# Patient Record
Sex: Male | Born: 1980 | Race: Black or African American | Hispanic: No | Marital: Single | Smoking: Never smoker
Health system: Southern US, Community
[De-identification: ages and names within clinical notes are randomized; demographics above are authoritative.]

## PROBLEM LIST (undated history)

## (undated) DIAGNOSIS — G43909 Migraine, unspecified, not intractable, without status migrainosus: Secondary | ICD-10-CM

## (undated) HISTORY — PX: OTHER SURGICAL HISTORY: SHX169

---

## 2008-06-29 ENCOUNTER — Emergency Department (HOSPITAL_COMMUNITY): Admission: EM | Admit: 2008-06-29 | Discharge: 2008-06-29 | Payer: Self-pay | Admitting: Emergency Medicine

## 2009-07-18 ENCOUNTER — Emergency Department (HOSPITAL_COMMUNITY): Admission: EM | Admit: 2009-07-18 | Discharge: 2009-07-18 | Payer: Self-pay | Admitting: Emergency Medicine

## 2009-08-08 ENCOUNTER — Emergency Department (HOSPITAL_COMMUNITY): Admission: EM | Admit: 2009-08-08 | Discharge: 2009-08-08 | Payer: Self-pay | Admitting: Emergency Medicine

## 2010-01-25 ENCOUNTER — Emergency Department (HOSPITAL_COMMUNITY): Admission: EM | Admit: 2010-01-25 | Discharge: 2010-01-25 | Payer: Self-pay | Admitting: Emergency Medicine

## 2010-04-16 ENCOUNTER — Emergency Department (HOSPITAL_COMMUNITY): Admission: EM | Admit: 2010-04-16 | Discharge: 2010-04-16 | Payer: Self-pay | Admitting: Emergency Medicine

## 2010-10-10 LAB — DIFFERENTIAL
Basophils Absolute: 0 10*3/uL (ref 0.0–0.1)
Basophils Relative: 0 % (ref 0–1)
Eosinophils Absolute: 0 10*3/uL (ref 0.0–0.7)
Lymphocytes Relative: 35 % (ref 12–46)
Lymphs Abs: 1.7 10*3/uL (ref 0.7–4.0)
Monocytes Absolute: 0.5 10*3/uL (ref 0.1–1.0)
Monocytes Relative: 10 % (ref 3–12)
Neutrophils Relative %: 54 % (ref 43–77)

## 2010-10-10 LAB — CBC
Hemoglobin: 15.1 g/dL (ref 13.0–17.0)
MCH: 31.5 pg (ref 26.0–34.0)
MCV: 91.3 fL (ref 78.0–100.0)
RBC: 4.8 MIL/uL (ref 4.22–5.81)
RDW: 12.9 % (ref 11.5–15.5)
WBC: 4.9 10*3/uL (ref 4.0–10.5)

## 2010-10-10 LAB — COMPREHENSIVE METABOLIC PANEL
AST: 22 U/L (ref 0–37)
Calcium: 9.1 mg/dL (ref 8.4–10.5)
Creatinine, Ser: 0.96 mg/dL (ref 0.4–1.5)
Glucose, Bld: 84 mg/dL (ref 70–99)
Potassium: 3.7 mEq/L (ref 3.5–5.1)

## 2010-10-10 LAB — HEMOCCULT GUIAC POC 1CARD (OFFICE): Fecal Occult Bld: NEGATIVE

## 2011-05-02 LAB — CBC
HCT: 44.9 % (ref 39.0–52.0)
Hemoglobin: 15 g/dL (ref 13.0–17.0)
MCHC: 33.4 g/dL (ref 30.0–36.0)
MCV: 94.8 fL (ref 78.0–100.0)
RDW: 13.7 % (ref 11.5–15.5)

## 2011-05-02 LAB — URINE MICROSCOPIC-ADD ON

## 2011-05-02 LAB — COMPREHENSIVE METABOLIC PANEL
AST: 33 U/L (ref 0–37)
Albumin: 4.2 g/dL (ref 3.5–5.2)
BUN: 10 mg/dL (ref 6–23)
Calcium: 9.1 mg/dL (ref 8.4–10.5)
Total Protein: 7.3 g/dL (ref 6.0–8.3)

## 2011-05-02 LAB — DIFFERENTIAL
Eosinophils Absolute: 0 10*3/uL (ref 0.0–0.7)
Eosinophils Relative: 1 % (ref 0–5)
Lymphs Abs: 2.3 10*3/uL (ref 0.7–4.0)
Monocytes Relative: 12 % (ref 3–12)
Neutrophils Relative %: 49 % (ref 43–77)

## 2011-05-02 LAB — URINALYSIS, ROUTINE W REFLEX MICROSCOPIC
Glucose, UA: NEGATIVE mg/dL
Protein, ur: 30 mg/dL — AB
Specific Gravity, Urine: 1.037 — ABNORMAL HIGH (ref 1.005–1.030)
Urobilinogen, UA: 1 mg/dL (ref 0.0–1.0)
pH: 6 (ref 5.0–8.0)

## 2013-01-03 ENCOUNTER — Emergency Department (HOSPITAL_COMMUNITY)
Admission: EM | Admit: 2013-01-03 | Discharge: 2013-01-03 | Discharge status: Against Medical Advice | Payer: Self-pay | Attending: Emergency Medicine | Admitting: Emergency Medicine

## 2013-01-03 ENCOUNTER — Encounter (HOSPITAL_COMMUNITY): Payer: Self-pay | Admitting: Family Medicine

## 2013-01-03 DIAGNOSIS — R110 Nausea: Secondary | ICD-10-CM | POA: Insufficient documentation

## 2013-01-03 DIAGNOSIS — H53149 Visual discomfort, unspecified: Secondary | ICD-10-CM | POA: Insufficient documentation

## 2013-01-03 DIAGNOSIS — G43909 Migraine, unspecified, not intractable, without status migrainosus: Secondary | ICD-10-CM | POA: Insufficient documentation

## 2013-01-03 HISTORY — DX: Migraine, unspecified, not intractable, without status migrainosus: G43.909

## 2013-01-03 NOTE — ED Notes (Signed)
Per pt sts migraine that started this am with hx of the same. sts left side of his head associated with nausea and photophobia.

## 2013-01-03 NOTE — ED Notes (Signed)
Pt did not answer X3.

## 2018-06-14 ENCOUNTER — Emergency Department (HOSPITAL_COMMUNITY): Payer: BC Managed Care – PPO

## 2018-06-14 ENCOUNTER — Observation Stay (HOSPITAL_COMMUNITY)
Admission: EM | Admit: 2018-06-14 | Discharge: 2018-06-15 | Disposition: A | Discharge status: Home / Self Care | Payer: BC Managed Care – PPO | Attending: Internal Medicine | Admitting: Internal Medicine

## 2018-06-14 ENCOUNTER — Other Ambulatory Visit: Payer: Self-pay

## 2018-06-14 ENCOUNTER — Encounter (HOSPITAL_COMMUNITY): Payer: Self-pay | Admitting: Emergency Medicine

## 2018-06-14 DIAGNOSIS — M940 Chondrocostal junction syndrome [Tietze]: Principal | ICD-10-CM

## 2018-06-14 DIAGNOSIS — E669 Obesity, unspecified: Secondary | ICD-10-CM | POA: Diagnosis not present

## 2018-06-14 DIAGNOSIS — R200 Anesthesia of skin: Secondary | ICD-10-CM | POA: Diagnosis not present

## 2018-06-14 DIAGNOSIS — R079 Chest pain, unspecified: Secondary | ICD-10-CM | POA: Diagnosis present

## 2018-06-14 DIAGNOSIS — R0789 Other chest pain: Secondary | ICD-10-CM | POA: Diagnosis present

## 2018-06-14 DIAGNOSIS — E66813 Obesity, class 3: Secondary | ICD-10-CM

## 2018-06-14 LAB — CBC WITH DIFFERENTIAL/PLATELET
ABS IMMATURE GRANULOCYTES: 0.02 10*3/uL (ref 0.00–0.07)
Basophils Absolute: 0 10*3/uL (ref 0.0–0.1)
Basophils Relative: 0 %
EOS PCT: 2 %
Eosinophils Absolute: 0.1 10*3/uL (ref 0.0–0.5)
HEMATOCRIT: 45.2 % (ref 39.0–52.0)
HEMOGLOBIN: 14.4 g/dL (ref 13.0–17.0)
Immature Granulocytes: 0 %
LYMPHS PCT: 39 %
Lymphs Abs: 2.6 10*3/uL (ref 0.7–4.0)
MCH: 29.8 pg (ref 26.0–34.0)
MCHC: 31.9 g/dL (ref 30.0–36.0)
MCV: 93.6 fL (ref 80.0–100.0)
MONO ABS: 0.8 10*3/uL (ref 0.1–1.0)
MONOS PCT: 11 %
Neutro Abs: 3.1 10*3/uL (ref 1.7–7.7)
Neutrophils Relative %: 48 %
Platelets: 206 10*3/uL (ref 150–400)
RBC: 4.83 MIL/uL (ref 4.22–5.81)
RDW: 12.6 % (ref 11.5–15.5)
WBC: 6.6 10*3/uL (ref 4.0–10.5)
nRBC: 0 % (ref 0.0–0.2)

## 2018-06-14 LAB — COMPREHENSIVE METABOLIC PANEL
ALK PHOS: 48 U/L (ref 38–126)
ALT: 13 U/L (ref 0–44)
AST: 20 U/L (ref 15–41)
Albumin: 3.7 g/dL (ref 3.5–5.0)
Anion gap: 8 (ref 5–15)
BILIRUBIN TOTAL: 0.8 mg/dL (ref 0.3–1.2)
BUN: 13 mg/dL (ref 6–20)
CALCIUM: 7.7 mg/dL — AB (ref 8.9–10.3)
CO2: 22 mmol/L (ref 22–32)
CREATININE: 1.42 mg/dL — AB (ref 0.61–1.24)
Chloride: 113 mmol/L — ABNORMAL HIGH (ref 98–111)
Glucose, Bld: 78 mg/dL (ref 70–99)
Potassium: 3.2 mmol/L — ABNORMAL LOW (ref 3.5–5.1)
Sodium: 143 mmol/L (ref 135–145)
TOTAL PROTEIN: 6.6 g/dL (ref 6.5–8.1)

## 2018-06-14 LAB — TROPONIN I

## 2018-06-14 MED ORDER — NITROGLYCERIN 0.4 MG SL SUBL
0.4000 mg | SUBLINGUAL_TABLET | SUBLINGUAL | Status: AC | PRN
  Administered 2018-06-14 – 2018-06-15 (×3): 0.4 mg via SUBLINGUAL
  Filled 2018-06-14 (×2): qty 1

## 2018-06-14 MED ORDER — FENTANYL CITRATE (PF) 100 MCG/2ML IJ SOLN
50.0000 ug | Freq: Once | INTRAMUSCULAR | Status: AC
  Administered 2018-06-15: 50 ug via INTRAVENOUS
  Filled 2018-06-14: qty 2

## 2018-06-14 MED ORDER — IOPAMIDOL (ISOVUE-370) INJECTION 76%
100.0000 mL | Freq: Once | INTRAVENOUS | Status: AC | PRN
  Administered 2018-06-14: 100 mL via INTRAVENOUS

## 2018-06-14 MED ORDER — IOPAMIDOL (ISOVUE-370) INJECTION 76%
INTRAVENOUS | Status: AC
  Filled 2018-06-14: qty 100

## 2018-06-14 MED ORDER — ASPIRIN 81 MG PO CHEW
324.0000 mg | CHEWABLE_TABLET | Freq: Once | ORAL | Status: AC
  Administered 2018-06-15: 324 mg via ORAL
  Filled 2018-06-14: qty 4

## 2018-06-14 NOTE — ED Notes (Signed)
After nitro, pt says his pain has felt like it has moved farther left in his chest and feels like his anterior thigh is falling asleep. Updated on plan at this time

## 2018-06-14 NOTE — ED Notes (Signed)
Pt is in ct

## 2018-06-14 NOTE — ED Triage Notes (Signed)
Pt reports left sided chest pain that radiated to his neck and left arm that started at 1500 today. Pain worse with inspiration, c/o left hand swelling and numbness. Denies shortness of breath/nausea/diaphoresis.

## 2018-06-14 NOTE — ED Provider Notes (Signed)
Emergency Department Provider Note   I have reviewed the triage vital signs and the nursing notes.   HISTORY  Chief Complaint Chest Pain   HPI [name] is a 37 y.o. male without significant past medical history the presents to the emergency department today with chest pain.  Patient states that he was at rest and he had acute onset of left-sided and central chest pain that radiated to his left neck and left arm.  Shortly after he started having left arm paresthesias.  This was new for him he got his blood pressure check it by the nurse at school and was told that it was high.  Patient without any headache, vision changes, abdominal pain or lower extremity symptoms during this time.  Came here for further evaluation.  No recent illnesses, fever or cough. No other associated or modifying symptoms.    Past Medical History:  Diagnosis Date  . Migraine     Patient Active Problem List   Diagnosis Date Noted  . Chest pain 06/15/2018    History reviewed. No pertinent surgical history.    Allergies Patient has no known allergies.  No family history on file.  Social History Social History   Tobacco Use  . Smoking status: Never Smoker  . Smokeless tobacco: Never Used  Substance Use Topics  . Alcohol use: Yes    Frequency: Never    Comment: occasional  . Drug use: Never    Review of Systems  All other systems negative except as documented in the HPI. All pertinent positives and negatives as reviewed in the HPI. ____________________________________________   PHYSICAL EXAM:  VITAL SIGNS: ED Triage Vitals  Enc Vitals Group     BP 06/14/18 1845 130/78     Pulse Rate 06/14/18 1845 72     Resp 06/14/18 1845 20     Temp 06/14/18 1845 98.5 F (36.9 C)     Temp Source 06/14/18 1845 Oral     SpO2 06/14/18 1845 96 %     Weight 06/14/18 1842 265 lb (120.2 kg)     Height 06/14/18 1842 6' (1.829 m)     Head Circumference --      Peak Flow --      Pain Score  06/14/18 1842 8     Pain Loc --      Pain Edu? --      Excl. in GC? --     Constitutional: Alert and oriented. Well appearing and in no acute distress. Eyes: Conjunctivae are normal. PERRL. EOMI. Head: Atraumatic. Nose: No congestion/rhinnorhea. Mouth/Throat: Mucous membranes are moist.  Oropharynx non-erythematous. Neck: No stridor.  No meningeal signs.   Cardiovascular: Normal rate, regular rhythm. Good peripheral circulation. Grossly normal heart sounds.   Respiratory: Normal respiratory effort.  No retractions. Lungs CTAB. Gastrointestinal: Soft and nontender. No distention.  Musculoskeletal: No lower extremity tenderness nor edema. No gross deformities of extremities. Neurologic:  Normal speech and language. No gross focal neurologic deficits are appreciated.  Skin:  Skin is warm, dry and intact. No rash noted.   ____________________________________________   LABS (all labs ordered are listed, but only abnormal results are displayed)  Labs Reviewed  COMPREHENSIVE METABOLIC PANEL - Abnormal; Notable for the following components:      Result Value   Potassium 3.2 (*)    Chloride 113 (*)    Creatinine, Ser 1.42 (*)    Calcium 7.7 (*)    All other components within normal limits  CBC WITH DIFFERENTIAL/PLATELET  TROPONIN I  RAPID URINE DRUG SCREEN, HOSP PERFORMED   ____________________________________________  EKG   EKG Interpretation  Date/Time:  Monday June 14 2018 19:27:35 EST Ventricular Rate:  79 PR Interval:    QRS Duration: 89 QT Interval:  382 QTC Calculation: 438 R Axis:   19 Text Interpretation:  Sinus rhythm No old tracing to compare Confirmed by Marily MemosMesner, Banita Lehn [phone]([id number]) on 06/14/2018 10:01:29 PM       ____________________________________________  RADIOLOGY  Ct Angio Chest/abd/pel For Dissection W And/or Wo Contrast  Result Date: 06/14/2018 CLINICAL DATA:  Left-sided chest pain radiating to neck and left arm. Pain worse with inspiration. Left  hand swelling and numbness. EXAM: CT ANGIOGRAPHY CHEST, ABDOMEN AND PELVIS TECHNIQUE: Multidetector CT imaging through the chest, abdomen and pelvis was performed using the standard protocol during bolus administration of intravenous contrast. Multiplanar reconstructed images and MIPs were obtained and reviewed to evaluate the vascular anatomy. CONTRAST:  100mL ISOVUE-370 IOPAMIDOL (ISOVUE-370) INJECTION 76% COMPARISON:  None. FINDINGS: CTA CHEST FINDINGS Cardiovascular: Satisfactory opacification of the pulmonary arteries to the segmental level. No evidence of pulmonary embolism. Normal heart size. No pericardial effusion. Mediastinum/Nodes: No enlarged mediastinal, hilar, or axillary lymph nodes. Thyroid gland, trachea, and esophagus demonstrate no significant findings. Lungs/Pleura: Central airways are normal. Scattered subsegmental atelectasis. No pneumothorax. No suspicious pulmonary nodules, masses, or infiltrates. Musculoskeletal: See below. Review of the MIP images confirms the above findings. CTA ABDOMEN AND PELVIS FINDINGS VASCULAR Aorta: Normal caliber aorta without aneurysm, dissection, vasculitis or significant stenosis. Celiac: Patent without evidence of aneurysm, dissection, vasculitis or significant stenosis. SMA: Patent without evidence of aneurysm, dissection, vasculitis or significant stenosis. Renals: There are 2 right renal arteries, a dominant and an accessory and 1 left renal artery. The renal arteries are unremarkable. IMA: Patent without evidence of aneurysm, dissection, vasculitis or significant stenosis. Inflow: Patent without evidence of aneurysm, dissection, vasculitis or significant stenosis. Veins: No obvious venous abnormality within the limitations of this arterial phase study. Review of the MIP images confirms the above findings. NON-VASCULAR Hepatobiliary: No focal liver abnormality is seen. No gallstones, gallbladder wall thickening, or biliary dilatation. Pancreas: Unremarkable.  No pancreatic ductal dilatation or surrounding inflammatory changes. Spleen: Normal in size without focal abnormality. Adrenals/Urinary Tract: Adrenal glands are unremarkable. Kidneys are normal, without renal calculi, focal lesion, or hydronephrosis. Bladder is unremarkable. Stomach/Bowel: Stomach is within normal limits. Appendix appears normal. No evidence of bowel wall thickening, distention, or inflammatory changes. Lymphatic: No significant vascular findings are present. No enlarged abdominal or pelvic lymph nodes. Reproductive: Prostate is unremarkable. Other: Left inguinal hernia repair, intact. Fat containing umbilical hernia. Musculoskeletal: No acute or significant osseous findings. Review of the MIP images confirms the above findings. IMPRESSION: 1. No abdominal aortic aneurysm or dissection. No cause for symptoms identified. 2. Left inguinal hernia repair. 3. Scattered subsegmental atelectasis throughout the lungs. Electronically Signed   By: Gerome Samavid  Williams III M.D   On: 06/14/2018 21:32    ____________________________________________   PROCEDURES  Procedure(s) performed:   Procedures   ____________________________________________   INITIAL IMPRESSION / ASSESSMENT AND PLAN / ED COURSE  Initial concern for ACS secondary to his story versus dissection or pulmonary embolus.  Also considered CVA but thought to be less likely with just paresthesias in his left arm.   Troponin EKG unremarkable.  CT dissection study done unremarkable.  Patient's pain improved with nitroglycerin however seems to have returned so we will give another dose of nitroglycerin and fentanyl.  He is only 37 years old  and does not have a lot of risk factors however his story is slightly concerning for an ACS component especially with his pain is returning after improving initially with the nitroglycerin.  Consider GI cause very does have any nausea, burning or other history of reflux.  Will discuss with hospitalist  for observation.  Pertinent labs & imaging results that were available during my care of the patient were reviewed by me and considered in my medical decision making (see chart for details).  ____________________________________________  FINAL CLINICAL IMPRESSION(S) / ED DIAGNOSES  Final diagnoses:  Nonspecific chest pain     MEDICATIONS GIVEN DURING THIS VISIT:  Medications  nitroGLYCERIN (NITROSTAT) SL tablet 0.4 mg (0.4 mg Sublingual Given 06/14/18 1932)  iopamidol (ISOVUE-370) 76 % injection (has no administration in time range)  iopamidol (ISOVUE-370) 76 % injection 100 mL (100 mLs Intravenous Contrast Given 06/14/18 2053)  fentaNYL (SUBLIMAZE) injection 50 mcg (50 mcg Intravenous Given 06/15/18 0016)  aspirin chewable tablet 324 mg (324 mg Oral Given 06/15/18 0017)     NEW OUTPATIENT MEDICATIONS STARTED DURING THIS VISIT:  There are no discharge medications for this patient.   Note:  This note was prepared with assistance of Dragon voice recognition software. Occasional wrong-word or sound-a-like substitutions may have occurred due to the inherent limitations of voice recognition software.   Marily Memos, MD 06/15/18 [phone]

## 2018-06-15 ENCOUNTER — Encounter (HOSPITAL_COMMUNITY): Payer: Self-pay | Admitting: Internal Medicine

## 2018-06-15 ENCOUNTER — Observation Stay (HOSPITAL_COMMUNITY): Payer: BC Managed Care – PPO

## 2018-06-15 ENCOUNTER — Observation Stay (HOSPITAL_BASED_OUTPATIENT_CLINIC_OR_DEPARTMENT_OTHER): Payer: BC Managed Care – PPO

## 2018-06-15 ENCOUNTER — Other Ambulatory Visit: Payer: Self-pay

## 2018-06-15 DIAGNOSIS — R0789 Other chest pain: Secondary | ICD-10-CM | POA: Diagnosis not present

## 2018-06-15 DIAGNOSIS — R079 Chest pain, unspecified: Secondary | ICD-10-CM | POA: Diagnosis present

## 2018-06-15 DIAGNOSIS — E66813 Obesity, class 3: Secondary | ICD-10-CM

## 2018-06-15 DIAGNOSIS — M940 Chondrocostal junction syndrome [Tietze]: Secondary | ICD-10-CM

## 2018-06-15 LAB — RAPID URINE DRUG SCREEN, HOSP PERFORMED
AMPHETAMINES: NOT DETECTED
Barbiturates: NOT DETECTED
Benzodiazepines: NOT DETECTED
COCAINE: NOT DETECTED
Opiates: NOT DETECTED
TETRAHYDROCANNABINOL: NOT DETECTED

## 2018-06-15 LAB — BASIC METABOLIC PANEL
Anion gap: 11 (ref 5–15)
BUN: 13 mg/dL (ref 6–20)
CALCIUM: 8.4 mg/dL — AB (ref 8.9–10.3)
CHLORIDE: 107 mmol/L (ref 98–111)
CO2: 22 mmol/L (ref 22–32)
CREATININE: 1.17 mg/dL (ref 0.61–1.24)
GFR calc Af Amer: >60 mL/min (ref >60)
GFR calc non Af Amer: >60 mL/min (ref >60)
GLUCOSE: 121 mg/dL — AB (ref 70–99)
Potassium: 3.5 mmol/L (ref 3.5–5.1)
Sodium: 140 mmol/L (ref 135–145)

## 2018-06-15 LAB — ECHOCARDIOGRAM COMPLETE
Height: 73 in
Weight: 4457.6 oz

## 2018-06-15 LAB — LIPID PANEL
Cholesterol: 149 mg/dL (ref 0–200)
HDL: 49 mg/dL (ref >40)
LDL Cholesterol: 89 mg/dL (ref 0–99)
Total CHOL/HDL Ratio: 3 RATIO
Triglycerides: 53 mg/dL (ref <150)
VLDL: 11 mg/dL (ref 0–40)

## 2018-06-15 LAB — HEMOGLOBIN A1C
Hgb A1c MFr Bld: 5.5 % (ref 4.8–5.6)
Mean Plasma Glucose: 111.15 mg/dL

## 2018-06-15 LAB — HIV ANTIBODY (ROUTINE TESTING W REFLEX): HIV Screen 4th Generation wRfx: NONREACTIVE

## 2018-06-15 LAB — TROPONIN I: Troponin I: <0.03 ng/mL (ref <0.03)

## 2018-06-15 LAB — CBC
HCT: 41.5 % (ref 39.0–52.0)
Hemoglobin: 13.7 g/dL (ref 13.0–17.0)
MCH: 30.3 pg (ref 26.0–34.0)
MCHC: 33 g/dL (ref 30.0–36.0)
MCV: 91.8 fL (ref 80.0–100.0)
Platelets: 180 10*3/uL (ref 150–400)
RBC: 4.52 MIL/uL (ref 4.22–5.81)
RDW: 12.7 % (ref 11.5–15.5)
WBC: 5.5 10*3/uL (ref 4.0–10.5)
nRBC: 0 % (ref 0.0–0.2)

## 2018-06-15 LAB — MRSA PCR SCREENING: MRSA BY PCR: NEGATIVE

## 2018-06-15 MED ORDER — NAPROXEN 500 MG PO TABS: ORAL_TABLET | ORAL | 0 refills | Status: AC

## 2018-06-15 MED ORDER — ACETAMINOPHEN 650 MG RE SUPP: 650.0000 mg | Freq: Four times a day (QID) | RECTAL | Status: DC | PRN

## 2018-06-15 MED ORDER — DICLOFENAC SODIUM 1 % TD GEL
2.0000 g | Freq: Four times a day (QID) | TRANSDERMAL | Status: DC
  Administered 2018-06-15: 2 g via TOPICAL
  Filled 2018-06-15: qty 100

## 2018-06-15 MED ORDER — ONDANSETRON HCL 4 MG PO TABS: 4.0000 mg | ORAL_TABLET | Freq: Four times a day (QID) | ORAL | Status: DC | PRN

## 2018-06-15 MED ORDER — ACETAMINOPHEN 325 MG PO TABS: 650.0000 mg | ORAL_TABLET | Freq: Four times a day (QID) | ORAL | Status: DC | PRN

## 2018-06-15 MED ORDER — DICLOFENAC SODIUM 1 % TD GEL: 2.0000 g | Freq: Four times a day (QID) | TRANSDERMAL | 0 refills | Status: AC

## 2018-06-15 MED ORDER — ONDANSETRON HCL 4 MG/2ML IJ SOLN: 4.0000 mg | Freq: Four times a day (QID) | INTRAMUSCULAR | Status: DC | PRN

## 2018-06-15 MED ORDER — MORPHINE SULFATE (PF) 2 MG/ML IV SOLN
2.0000 mg | INTRAVENOUS | Status: DC | PRN
  Administered 2018-06-15: 2 mg via INTRAVENOUS
  Filled 2018-06-15: qty 1

## 2018-06-15 MED ORDER — ASPIRIN EC 81 MG PO TBEC
81.0000 mg | DELAYED_RELEASE_TABLET | Freq: Every day | ORAL | Status: DC
  Administered 2018-06-15: 81 mg via ORAL
  Filled 2018-06-15: qty 1

## 2018-06-15 MED ORDER — ALUM & MAG HYDROXIDE-SIMETH 200-200-20 MG/5ML PO SUSP
30.0000 mL | Freq: Once | ORAL | Status: AC
  Administered 2018-06-15: 30 mL via ORAL
  Filled 2018-06-15: qty 30

## 2018-06-15 MED ORDER — ENOXAPARIN SODIUM 40 MG/0.4ML ~~LOC~~ SOLN: 40.0000 mg | SUBCUTANEOUS | Status: DC

## 2018-06-15 MED ORDER — KETOROLAC TROMETHAMINE 30 MG/ML IJ SOLN
30.0000 mg | Freq: Once | INTRAMUSCULAR | Status: AC
  Administered 2018-06-15: 30 mg via INTRAVENOUS
  Filled 2018-06-15: qty 1

## 2018-06-15 MED ORDER — ACETAMINOPHEN 325 MG PO TABS: 650.0000 mg | ORAL_TABLET | Freq: Four times a day (QID) | ORAL | Status: AC | PRN

## 2018-06-15 MED ORDER — FAMOTIDINE 20 MG PO TABS: 20.0000 mg | ORAL_TABLET | Freq: Two times a day (BID) | ORAL | 1 refills | Status: AC | PRN

## 2018-06-15 MED ORDER — LIDOCAINE VISCOUS HCL 2 % MT SOLN
15.0000 mL | Freq: Once | OROMUCOSAL | Status: AC
  Administered 2018-06-15: 15 mL via ORAL
  Filled 2018-06-15: qty 15

## 2018-06-15 NOTE — H&P (Signed)
History and Physical    Arthur Brewer:[id number] DOB: [birth year] DOA: 06/14/2018  PCP: Patient, No Pcp Per  Patient coming from: Home.  Chief Complaint: Chest pain.  HPI: Dia Jefferys is a 37 y.o. male with no significant past medical history presents to the ER with complaints of chest pain.  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  Pain was retrosternal radiating to his left arm along with some numbness of his left arms which has persisted.  Denies any weakness of the extremities or any facial symptoms.  Denies any shortness of breath productive cough fever or chills.  At his workplace patient blood pressure was found to be elevated when checked.  Due to persistent symptoms patient drove to the ER.  ED Course: In the ER blood pressure was normal chest pain improved with sublingual nitroglycerin but started coming back for which patient was given pain relief medications.  EKG was normal sinus rhythm nonspecific changes chest x-ray unremarkable troponin negative patient admitted for further observation given that patient had some improvement with nitroglycerin.  Patient had a CT dissection study which was negative.  Review of Systems: As per HPI, rest all negative.   Past Medical History:  Diagnosis Date  . Migraine     Past Surgical History:  Procedure Laterality Date  . facial surgey       reports that he has never smoked. He has never used smokeless tobacco. He reports that he drinks alcohol. He reports that he does not use drugs.  No Known Allergies  Family History  Problem Relation Age of Onset  . Congestive Heart Failure Father   . CAD Cousin     Prior to Admission medications   Not on File    Physical Exam: Vitals:   06/14/18 2000 06/14/18 2237 06/14/18 2342 06/15/18 0031  BP: 108/70 95/62 104/79 121/86  Pulse: 72 68 69 (!) 56  Resp: (!) 22 20 18 17   Temp:    98.4 F (36.9 C)  TempSrc:    Oral   SpO2: 96% 95% 96% 94%  Weight:    126.4 kg  Height:    6\' 1"  (1.854 m)      Constitutional: Moderately built and nourished. Vitals:   06/14/18 2000 06/14/18 2237 06/14/18 2342 06/15/18 0031  BP: 108/70 95/62 104/79 121/86  Pulse: 72 68 69 (!) 56  Resp: (!) 22 20 18 17   Temp:    98.4 F (36.9 C)  TempSrc:    Oral  SpO2: 96% 95% 96% 94%  Weight:    126.4 kg  Height:    6\' 1"  (1.854 m)   Eyes: Anicteric no pallor. ENMT: No discharge from the ears eyes nose or mouth. Neck: No mass felt.  No neck rigidity. Respiratory: No rhonchi or crepitations. Cardiovascular: S1-S2 heard no murmurs appreciated. Abdomen: Soft nontender bowel sounds present. Musculoskeletal: No edema.  No joint effusion. Skin: No rash. Neurologic: Alert awake oriented to time place and person.  Moves all extremities 5 x 5.  No facial asymmetry tongue is midline.  Pupils are equal and reacting to light. Psychiatric: Appears normal per normal affect.   Labs on Admission: I have personally reviewed following labs and imaging studies  CBC: Recent Labs  Lab 06/14/18 1932  WBC 6.6  NEUTROABS 3.1  HGB 14.4  HCT 45.2  MCV 93.6  PLT 206   Basic Metabolic Panel: Recent Labs  Lab 06/14/18 1932  NA 143  K 3.2*  CL 113*  CO2 22  GLUCOSE 78  BUN 13  CREATININE 1.42*  CALCIUM 7.7*   GFR: Estimated Creatinine Clearance: 99.2 mL/min (A) (by C-G formula based on SCr of 1.42 mg/dL (H)). Liver Function Tests: Recent Labs  Lab 06/14/18 1932  AST 20  ALT 13  ALKPHOS 48  BILITOT 0.8  PROT 6.6  ALBUMIN 3.7   No results for input(s): LIPASE, AMYLASE in the last 168 hours. No results for input(s): AMMONIA in the last 168 hours. Coagulation Profile: No results for input(s): INR, PROTIME in the last 168 hours. Cardiac Enzymes: Recent Labs  Lab 06/14/18 1932  TROPONINI <0.03   BNP (last 3 results) No results for input(s): PROBNP in the last 8760 hours. HbA1C: No results for input(s): HGBA1C in the  last 72 hours. CBG: No results for input(s): GLUCAP in the last 168 hours. Lipid Profile: No results for input(s): CHOL, HDL, LDLCALC, TRIG, CHOLHDL, LDLDIRECT in the last 72 hours. Thyroid Function Tests: No results for input(s): TSH, T4TOTAL, FREET4, T3FREE, THYROIDAB in the last 72 hours. Anemia Panel: No results for input(s): VITAMINB12, FOLATE, FERRITIN, TIBC, IRON, RETICCTPCT in the last 72 hours. Urine analysis:    Component Value Date/Time   COLORURINE AMBER BIOCHEMICALS MAY BE AFFECTED BY COLOR (A) 06/29/2008 0123   APPEARANCEUR CLEAR 06/29/2008 0123   LABSPEC 1.037 (H) 06/29/2008 0123   PHURINE 6.0 06/29/2008 0123   GLUCOSEU NEGATIVE 06/29/2008 0123   HGBUR NEGATIVE 06/29/2008 0123   BILIRUBINUR SMALL (A) 06/29/2008 0123   KETONESUR 15 (A) 06/29/2008 0123   PROTEINUR 30 (A) 06/29/2008 0123   UROBILINOGEN 1.0 06/29/2008 0123   NITRITE NEGATIVE 06/29/2008 0123   LEUKOCYTESUR NEGATIVE 06/29/2008 0123   Sepsis Labs: @LABRCNTIP (procalcitonin:4,lacticidven:4) )No results found for this or any previous visit (from the past 240 hour(s)).   Radiological Exams on Admission: Ct Angio Chest/abd/pel For Dissection W And/or Wo Contrast  Result Date: 06/14/2018 CLINICAL DATA:  Left-sided chest pain radiating to neck and left arm. Pain worse with inspiration. Left hand swelling and numbness. EXAM: CT ANGIOGRAPHY CHEST, ABDOMEN AND PELVIS TECHNIQUE: Multidetector CT imaging through the chest, abdomen and pelvis was performed using the standard protocol during bolus administration of intravenous contrast. Multiplanar reconstructed images and MIPs were obtained and reviewed to evaluate the vascular anatomy. CONTRAST:  100mL ISOVUE-370 IOPAMIDOL (ISOVUE-370) INJECTION 76% COMPARISON:  None. FINDINGS: CTA CHEST FINDINGS Cardiovascular: Satisfactory opacification of the pulmonary arteries to the segmental level. No evidence of pulmonary embolism. Normal heart size. No pericardial effusion.  Mediastinum/Nodes: No enlarged mediastinal, hilar, or axillary lymph nodes. Thyroid gland, trachea, and esophagus demonstrate no significant findings. Lungs/Pleura: Central airways are normal. Scattered subsegmental atelectasis. No pneumothorax. No suspicious pulmonary nodules, masses, or infiltrates. Musculoskeletal: See below. Review of the MIP images confirms the above findings. CTA ABDOMEN AND PELVIS FINDINGS VASCULAR Aorta: Normal caliber aorta without aneurysm, dissection, vasculitis or significant stenosis. Celiac: Patent without evidence of aneurysm, dissection, vasculitis or significant stenosis. SMA: Patent without evidence of aneurysm, dissection, vasculitis or significant stenosis. Renals: There are 2 right renal arteries, a dominant and an accessory and 1 left renal artery. The renal arteries are unremarkable. IMA: Patent without evidence of aneurysm, dissection, vasculitis or significant stenosis. Inflow: Patent without evidence of aneurysm, dissection, vasculitis or significant stenosis. Veins: No obvious venous abnormality within the limitations of this arterial phase study. Review of the MIP images confirms the above findings. NON-VASCULAR Hepatobiliary: No focal liver abnormality is seen. No gallstones, gallbladder wall thickening, or  biliary dilatation. Pancreas: Unremarkable. No pancreatic ductal dilatation or surrounding inflammatory changes. Spleen: Normal in size without focal abnormality. Adrenals/Urinary Tract: Adrenal glands are unremarkable. Kidneys are normal, without renal calculi, focal lesion, or hydronephrosis. Bladder is unremarkable. Stomach/Bowel: Stomach is within normal limits. Appendix appears normal. No evidence of bowel wall thickening, distention, or inflammatory changes. Lymphatic: No significant vascular findings are present. No enlarged abdominal or pelvic lymph nodes. Reproductive: Prostate is unremarkable. Other: Left inguinal hernia repair, intact. Fat containing  umbilical hernia. Musculoskeletal: No acute or significant osseous findings. Review of the MIP images confirms the above findings. IMPRESSION: 1. No abdominal aortic aneurysm or dissection. No cause for symptoms identified. 2. Left inguinal hernia repair. 3. Scattered subsegmental atelectasis throughout the lungs. Electronically Signed   By: Gerome Sam III M.D   On: 06/14/2018 21:32    EKG: Independently reviewed.  Normal sinus rhythm nonspecific ST-T changes.  Assessment/Plan Principal Problem:   Chest pain    1. Chest pain -patient has some atypical features and also some of the chest pain is reproducible on palpation.  However since patient symptoms improved with sublingual nitroglycerin we will cycle cardiac markers.  Cardiology notified. 2. Left upper extremity numbness -discussed with neurologist advised to get MRI brain.   DVT prophylaxis: Lovenox. Code Status: Full code. Family Communication: Discussed with patient. Disposition Plan: Home. Consults called: Cardiology. Admission status: Observation.   Eduard Clos MD [facility] Pager [phone].  If 7PM-7AM, please contact night-coverage [email or web address] Password [email or web address]  06/15/2018, 2:10 AM

## 2018-06-15 NOTE — Care Management (Signed)
Patient in need of PCP- Patient has [employer] and he call call [phone] on back of card to get a PCP in network. Patient is aware. No further needs from CM at this time. Gala LewandowskyGraves-Bigelow, Shianna Bally Kaye, RN,BSN Case Manager [phone]

## 2018-06-15 NOTE — Discharge Instructions (Signed)
Costochondritis Costochondritis is swelling and irritation (inflammation) of the tissue (cartilage) that connects your ribs to your breastbone (sternum). This causes pain in the front of your chest. Usually, the pain:  Starts gradually.  Is in more than one rib.  This condition usually goes away on its own over time. Follow these instructions at home:  Do not do anything that makes your pain worse.  If directed, put ice on the painful area: ? Put ice in a plastic bag. ? Place a towel between your skin and the bag. ? Leave the ice on for 20 minutes, 2-3 times a day.  If directed, put heat on the affected area as often as told by your doctor. Use the heat source that your doctor tells you to use, such as a moist heat pack or a heating pad. ? Place a towel between your skin and the heat source. ? Leave the heat on for 20-30 minutes. ? Take off the heat if your skin turns bright red. This is very important if you cannot feel pain, heat, or cold. You may have a greater risk of getting burned.  Take over-the-counter and prescription medicines only as told by your doctor.  Return to your normal activities as told by your doctor. Ask your doctor what activities are safe for you.  Keep all follow-up visits as told by your doctor. This is important. Contact a doctor if:  You have chills or a fever.  Your pain does not go away or it gets worse.  You have a cough that does not go away. Get help right away if:  You are short of breath. This information is not intended to replace advice given to you by your health care provider. Make sure you discuss any questions you have with your health care provider. Document Released: 12/31/2007 Document Revised: 02/01/2016 Document Reviewed: 11/07/2015 Elsevier Interactive Patient Education  [employer]. Please find a PCP and see him or her in 1 wk. For a follow up.   You were cared for by a hospitalist during your hospital stay. If you  have any questions about your discharge medications or the care you received while you were in the hospital after you are discharged, you can call the unit and asked to speak with the hospitalist on call if the hospitalist that took care of you is not available. Once you are discharged, your primary care physician will handle any further medical issues.   Please note that NO REFILLS for any discharge medications will be authorized once you are discharged, as it is imperative that you return to your primary care physician (or establish a relationship with a primary care physician if you do not have one) for your aftercare needs so that they can reassess your need for medications and monitor your lab values.  Please take all your medications with you for your next visit with your Primary MD. Please ask your Primary MD to get all Hospital records sent to his/her office. Please request your Primary MD to go over all hospital test results at the follow up.   If you experience worsening of your admission symptoms, develop shortness of breath, chest pain, suicidal or homicidal thoughts or a life threatening emergency, you must seek medical attention immediately by calling 911 or calling your MD.   Bonita Quin must read the complete instructions/literature along with all the possible adverse reactions/side effects for all the medicines you take including new medications that have been prescribed to you. Take new  medicines after you have completely understood and accpet all the possible adverse reactions/side effects.    Do not drive when taking pain medications or sedatives.     Do not take more than prescribed Pain, Sleep and Anxiety Medications   If you have smoked or chewed Tobacco in the last 2 yrs please stop. Stop any regular alcohol  and or recreational drug use.   Wear Seat belts while driving.

## 2018-06-15 NOTE — Discharge Summary (Signed)
Physician Discharge Summary  Arthur HeadlandChristopher Brewer ONG:[id number]:[id number] DOB: [birth year] DOA: 06/14/2018  PCP: Patient, No Pcp Per  Admit date: 06/14/2018 Discharge date: 06/15/2018  Admitted From: home  Disposition:  home   Recommendations for Outpatient Follow-up:  1. Needs to find PCP  Discharge Condition:  stable   CODE STATUS:  Full code    Consultations:  none    Discharge Diagnoses:  Principal Problem:   Acute costochondritis Active Problems:   Chest pain   Obesity, Class III, BMI 40-49.9 (morbid obesity) (HCC)      Brief Summary: Arthur HeadlandChristopher Pigue is a 37 y.o. male with no significant past medical history presents to the ER with complaints of chest pain.  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  Pain was retrosternal radiating to his left arm along with some numbness of his left arms which has persisted.  Denies any weakness of the extremities or any facial symptoms.  Denies any shortness of breath productive cough fever or chills.  At his workplace patient blood pressure was found to be elevated when checked.  Due to persistent symptoms patient drove to the ER.  Hospital Course:  Chest pain - left costochondritis  - CT chest unrevealing - EKG non- acute - 3 sets of Troponin neg - has point tenderness in left 3rd and 4th costochondral joints, pain worse when taking a deep breath or coughing - will give Toradol IV once prior to d/c and start Voltaren gel and Naproxen- no h/o gastritis  Numbness/ tingling in right fingers - possibly related to above- if it does not resolved, will need further work up - MRI brain noted below- he has a h/o Migraines   Numbness in right post thigh - has a small area of chronic numbness and tingling in right post thigh- no pain - asked to monitor for now and address with PCP   Morbid obesity - have discussed plan for weight loss Body mass index is 36.76 kg/m.    Discharge  Exam: Vitals:   06/15/18 0929 06/15/18 0936  BP: 117/82 107/67  Pulse:    Resp:  20  Temp:  98.5 F (36.9 C)  SpO2:  96%   Vitals:   06/15/18 0917 06/15/18 0924 06/15/18 0929 06/15/18 0936  BP: 119/82 119/80 117/82 107/67  Pulse: (!) 57 61    Resp: 20 19  20   Temp:    98.5 F (36.9 C)  TempSrc:    Oral  SpO2: 91% 94%  96%  Weight:      Height:        General: Pt is alert, awake, not in acute distress Cardiovascular: RRR, S1/S2 +, no rubs, no gallops Respiratory: CTA bilaterally, no wheezing, no rhonchi Abdominal: Soft, NT, ND, bowel sounds + Extremities: no edema, no cyanosis   Discharge Instructions  Discharge Instructions    Diet - low sodium heart healthy   Complete by:  As directed    Increase activity slowly   Complete by:  As directed      Allergies as of 06/15/2018   No Known Allergies     Medication List    TAKE these medications   acetaminophen 325 MG tablet Commonly known as:  TYLENOL Take 2 tablets (650 mg total) by mouth every 6 (six) hours as needed for mild pain (or Fever >/= 101).   diclofenac sodium 1 % Gel Commonly known as:  VOLTAREN Apply 2 g topically 4 (four) times daily.  famotidine 20 MG tablet Commonly known as:  PEPCID Take 1 tablet (20 mg total) by mouth 2 (two) times daily as needed for heartburn or indigestion.   naproxen 500 MG tablet Commonly known as:  NAPROSYN 500 mg every 12 hrs for 3 days and then decrease to 250 mg every 12 hrs- take for total 1 wk.      Follow-up Information    Philip Aspen, Limmie Patricia, MD. Schedule an appointment as soon as possible for a visit in 2 week(s).   Specialty:  Internal Medicine Contact information: [address] [place] [address] [phone]          No Known Allergies   Procedures/Studies:    Mr Brain Wo Contrast  Result Date: 06/15/2018 CLINICAL DATA:  Left arm paresthesias with preceding chest pain radiating to the neck and arm and associated high  blood pressure. History of migraines. EXAM: MRI HEAD WITHOUT CONTRAST TECHNIQUE: Multiplanar, multiecho pulse sequences of the brain and surrounding structures were obtained without intravenous contrast. COMPARISON:  Head CT 08/08/2009 FINDINGS: Brain: There is no evidence of acute infarct, intracranial hemorrhage, mass, midline shift, or extra-axial fluid collection. The ventricles and sulci are normal. There are multiple punctate foci of T2 hyperintensity in the white matter of both frontal lobes, mild in severity though abnormal for age. Vascular: Major intracranial vascular flow voids are preserved. Skull and upper cervical spine: Unremarkable bone marrow signal. Sinuses/Orbits: Unremarkable orbits. Minimal right maxillary and bilateral ethmoid sinus mucosal thickening. Clear mastoid air cells. Other: None. IMPRESSION: 1. No acute intracranial abnormality. 2. Foci of cerebral white matter T2 signal abnormality, nonspecific though may reflect the patient's history of migraines, early chronic small-vessel ischemia, or prior infection/inflammation. No findings specifically suggestive of demyelinating disease. Electronically Signed   By: Sebastian Ache M.D.   On: 06/15/2018 09:17   Ct Angio Chest/abd/pel For Dissection W And/or Wo Contrast  Result Date: 06/14/2018 CLINICAL DATA:  Left-sided chest pain radiating to neck and left arm. Pain worse with inspiration. Left hand swelling and numbness. EXAM: CT ANGIOGRAPHY CHEST, ABDOMEN AND PELVIS TECHNIQUE: Multidetector CT imaging through the chest, abdomen and pelvis was performed using the standard protocol during bolus administration of intravenous contrast. Multiplanar reconstructed images and MIPs were obtained and reviewed to evaluate the vascular anatomy. CONTRAST:  ISOVUE-370 IOPAMIDOL (ISOVUE-370) INJECTION 76% COMPARISON:  None. FINDINGS: CTA CHEST FINDINGS Cardiovascular: Satisfactory opacification of the pulmonary arteries to the segmental level. No  evidence of pulmonary embolism. Normal heart size. No pericardial effusion. Mediastinum/Nodes: No enlarged mediastinal, hilar, or axillary lymph nodes. Thyroid gland, trachea, and esophagus demonstrate no significant findings. Lungs/Pleura: Central airways are normal. Scattered subsegmental atelectasis. No pneumothorax. No suspicious pulmonary nodules, masses, or infiltrates. Musculoskeletal: See below. Review of the MIP images confirms the above findings. CTA ABDOMEN AND PELVIS FINDINGS VASCULAR Aorta: Normal caliber aorta without aneurysm, dissection, vasculitis or significant stenosis. Celiac: Patent without evidence of aneurysm, dissection, vasculitis or significant stenosis. SMA: Patent without evidence of aneurysm, dissection, vasculitis or significant stenosis. Renals: There are 2 right renal arteries, a dominant and an accessory and 1 left renal artery. The renal arteries are unremarkable. IMA: Patent without evidence of aneurysm, dissection, vasculitis or significant stenosis. Inflow: Patent without evidence of aneurysm, dissection, vasculitis or significant stenosis. Veins: No obvious venous abnormality within the limitations of this arterial phase study. Review of the MIP images confirms the above findings. NON-VASCULAR Hepatobiliary: No focal liver abnormality is seen. No gallstones, gallbladder wall thickening, or biliary dilatation.  Pancreas: Unremarkable. No pancreatic ductal dilatation or surrounding inflammatory changes. Spleen: Normal in size without focal abnormality. Adrenals/Urinary Tract: Adrenal glands are unremarkable. Kidneys are normal, without renal calculi, focal lesion, or hydronephrosis. Bladder is unremarkable. Stomach/Bowel: Stomach is within normal limits. Appendix appears normal. No evidence of bowel wall thickening, distention, or inflammatory changes. Lymphatic: No significant vascular findings are present. No enlarged abdominal or pelvic lymph nodes. Reproductive: Prostate is  unremarkable. Other: Left inguinal hernia repair, intact. Fat containing umbilical hernia. Musculoskeletal: No acute or significant osseous findings. Review of the MIP images confirms the above findings. IMPRESSION: 1. No abdominal aortic aneurysm or dissection. No cause for symptoms identified. 2. Left inguinal hernia repair. 3. Scattered subsegmental atelectasis throughout the lungs. Electronically Signed   By: Gerome Sam III M.D   On: 06/14/2018 21:32     The results of significant diagnostics from this hospitalization (including imaging, microbiology, ancillary and laboratory) are listed below for reference.     Microbiology: Recent Results (from the past 240 hour(s))  MRSA PCR Screening     Status: None   Collection Time: 06/15/18 12:50 AM  Result Value Ref Range Status   MRSA by PCR NEGATIVE NEGATIVE Final    Comment:        The GeneXpert MRSA Assay (FDA approved for NASAL specimens only), is one component of a comprehensive MRSA colonization surveillance program. It is not intended to diagnose MRSA infection nor to guide or monitor treatment for MRSA infections. Performed at [facility], [address], [place], [place] [address]      Labs: BNP (last 3 results) No results for input(s): BNP in the last 8760 hours. Basic Metabolic Panel: Recent Labs  Lab 06/14/18 1932 06/15/18 0213  NA 143 140  K 3.2* 3.5  CL 113* 107  CO2 22 22  GLUCOSE 78 121*  BUN 13 13  CREATININE 1.42* 1.17  CALCIUM 7.7* 8.4*   Liver Function Tests: Recent Labs  Lab 06/14/18 1932  AST 20  ALT 13  ALKPHOS 48  BILITOT 0.8  PROT 6.6  ALBUMIN 3.7   No results for input(s): LIPASE, AMYLASE in the last 168 hours. No results for input(s): AMMONIA in the last 168 hours. CBC: Recent Labs  Lab 06/14/18 1932 06/15/18 0213  WBC 6.6 5.5  NEUTROABS 3.1  --   HGB 14.4 13.7  HCT 45.2 41.5  MCV 93.6 91.8  PLT 206 180   Cardiac Enzymes: Recent Labs  Lab 06/14/18 1932  06/15/18 0213 06/15/18 0720  TROPONINI <0.03 <0.03 <0.03   BNP: Invalid input(s): POCBNP CBG: No results for input(s): GLUCAP in the last 168 hours. D-Dimer No results for input(s): DDIMER in the last 72 hours. Hgb A1c No results for input(s): HGBA1C in the last 72 hours. Lipid Profile No results for input(s): CHOL, HDL, LDLCALC, TRIG, CHOLHDL, LDLDIRECT in the last 72 hours. Thyroid function studies No results for input(s): TSH, T4TOTAL, T3FREE, THYROIDAB in the last 72 hours.  Invalid input(s): FREET3 Anemia work up No results for input(s): VITAMINB12, FOLATE, FERRITIN, TIBC, IRON, RETICCTPCT in the last 72 hours. Urinalysis    Component Value Date/Time   COLORURINE AMBER BIOCHEMICALS MAY BE AFFECTED BY COLOR (A) 06/29/2008 0123   APPEARANCEUR CLEAR 06/29/2008 0123   LABSPEC 1.037 (H) 06/29/2008 0123   PHURINE 6.0 06/29/2008 0123   GLUCOSEU NEGATIVE 06/29/2008 0123   HGBUR NEGATIVE 06/29/2008 0123   BILIRUBINUR SMALL (A) 06/29/2008 0123   KETONESUR 15 (A) 06/29/2008 0123   PROTEINUR  30 (A) 06/29/2008 0123   UROBILINOGEN 1.0 06/29/2008 0123   NITRITE NEGATIVE 06/29/2008 0123   LEUKOCYTESUR NEGATIVE 06/29/2008 0123   Sepsis Labs Invalid input(s): PROCALCITONIN,  WBC,  LACTICIDVEN Microbiology Recent Results (from the past 240 hour(s))  MRSA PCR Screening     Status: None   Collection Time: 06/15/18 12:50 AM  Result Value Ref Range Status   MRSA by PCR NEGATIVE NEGATIVE Final    Comment:        The GeneXpert MRSA Assay (FDA approved for NASAL specimens only), is one component of a comprehensive MRSA colonization surveillance program. It is not intended to diagnose MRSA infection nor to guide or monitor treatment for MRSA infections. Performed at [facility], [address] [place], [place] [address]      Time coordinating discharge in minutes: 65  SIGNED:   Calvert Cantor, MD  [facility] 06/15/2018, 10:57 AM Pager   If 7PM-7AM, please  contact night-coverage [email or web address] Password [email or web address]

## 2018-06-15 NOTE — Consult Note (Addendum)
Cardiology Consultation:   Patient ID: Arthur Brewer; [id number]; 1980-12-28   Admit date: 06/14/2018 Date of Consult: 06/15/2018  Primary Care Provider: Patient, No Pcp Per Primary Cardiologist: [facility]; Dr. Tenny Crawoss Primary Electrophysiologist:  None   Patient Profile:   Arthur HeadlandChristopher Brewer is a 37 y.o. male with a PMH of migraines and obesity, who is being seen today for the evaluation of chest pain at the request of Dr. Butler Denmarkizwan.  History of Present Illness:   Arthur Brewer was in his usual state of health until the afternoon of 06/14/18 when he developed sudden onset substernal chest pain radiating to his left arm while at rest. 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 Shortly after onset of chest pain he developed left arm numbness. He was evaluated by the school nurse and noted to have SBP in the 130s which he said was unusual for him. He reported some worsening of symptoms with deep breathing and nausea, but no change in symptoms with activity. He denied associated vomiting, dizziness, lightheadedness, or syncope. He had never experienced these symptoms in the past so he presented to the ED for further evaluation.   He has never seen a cardiologist in the past. He has never had an ischemic evaluation. He denies tobacco or recreational drug use. He reports social drinking ~1x per month. He reports family history of CAD in his 1st cousins - one died from an MI in his 6240s, the other has had MI's in his 1240s but is still living.   At the time of this evaluation he continues to have mild chest discomfort. He reports some tenderness to palpation in his lower sternum and worsening of pain with deep breathing. He reports persistent left hand numbness and left thigh numbness. He is active at baseline, coaching soccer and basketball, and reports  exercising 2 days per week on top of that. No recent changes in activity level or weight lifting routine. He reported a recent GI bug on Saturday 06/12/18 with N/V/D lasting less than 24 hours (son also had it). He denies personal history of HLD, HTN, or DM. He denies orthopnea, PND, LE edema, or problems with reflux.   Hospital course: VSS. Labs notable for K 3.2>3.5, Cr 1.42>1.17, CBC wnl, Trop negative x2. Utox negative. CTA C/A/P without dissection or aneurysm. Patient was given SL nitro in the ED with improvement of symptoms. Recurrent chest pain managed with fentanyl x1. Patient was admitted to medicine for observation. Cardiology asked to evaluate for chest pain.   Past Medical History:  Diagnosis Date  . Migraine     Past Surgical History:  Procedure Laterality Date  . facial surgey       Home Medications:  Prior to Admission medications   Not on File    Inpatient Medications: Scheduled Meds: . aspirin EC  81 mg Oral Daily  . enoxaparin (LOVENOX) injection  40 mg Subcutaneous Q24H  . iopamidol       Continuous Infusions:  PRN Meds: acetaminophen **OR** acetaminophen, nitroGLYCERIN, ondansetron **OR** ondansetron (ZOFRAN) IV  Allergies:   No Known Allergies  Social History:   Social History   Socioeconomic History  . Marital status: Single    Spouse name: Not on file  . Number of children: Not on file  . Years of education: Not on file  . Highest education level: Not on file  Occupational History  . Not on file  Social Needs  . Financial resource strain: Not on file  . Food insecurity:    Worry: Not on file    Inability: Not on file  . Transportation needs:    Medical: Not on file    Non-medical: Not on file  Tobacco Use  . Smoking status: Never Smoker  . Smokeless tobacco: Never Used  Substance and Sexual Activity  . Alcohol use: Yes    Frequency: Never    Comment: occasional  . Drug use: Never  . Sexual activity: Not on file  Lifestyle  . Physical  activity:    Days per week: Not on file    Minutes per session: Not on file  . Stress: Not on file  Relationships  . Social connections:    Talks on phone: Not on file    Gets together: Not on file    Attends religious service: Not on file    Active member of club or organization: Not on file    Attends meetings of clubs or organizations: Not on file    Relationship status: Not on file  . Intimate partner violence:    Fear of current or ex partner: Not on file    Emotionally abused: Not on file    Physically abused: Not on file    Forced sexual activity: Not on file  Other Topics Concern  . Not on file  Social History Narrative  . Not on file    Family History:    Family History  Problem Relation Age of Onset  . Congestive Heart Failure Father   . CAD Cousin      ROS:  Please see the history of present illness.   All other ROS reviewed and negative.     Physical Exam/Data:   Vitals:   06/14/18 2237 06/14/18 2342 06/15/18 0031 06/15/18 0424  BP: 95/62 104/79 121/86   Pulse: 68 69 (!) 56 (!) 59  Resp: 20 18 17  (!) 21  Temp:   98.4 F (36.9 C) 98.5 F (36.9 C)  TempSrc:   Oral Oral  SpO2: 95% 96% 94% 91%  Weight:   126.4 kg 126.4 kg  Height:   6\' 1"  (1.854 m)    No intake or output data in the 24 hours ending 06/15/18 0807 Filed Weights   06/14/18 1842 06/15/18 0031 06/15/18 0424  Weight: 120.2 kg 126.4 kg 126.4 kg   Body mass index is 36.76 kg/m.  General:  Well nourished, well developed, laying in bed in no acute distress HEENT: sclera anicteric  Neck: no JVD Vascular: No carotid bruits; distal pulses 2+ bilaterally Cardiac:  normal S1, S2; RRR; no murmurs, rubs, or gallops; TTP of lower sternum.  Lungs:  clear to auscultation bilaterally, no wheezing, rhonchi or rales  Abd: NABS, soft, nontender, no hepatomegaly Ext: no edema Musculoskeletal:  No deformities, BUE and BLE strength normal and equal Skin: warm and dry  Neuro:  CNs 2-12 intact, no focal  abnormalities noted Psych:  Normal affect   EKG:  The EKG was personally reviewed and demonstrates:  Sinus rhythm with submm STE in I and aVL (likely early repol), no STD or TWI, no comparison available Telemetry:  Telemetry was personally reviewed and demonstrates:  NSR  Relevant CV Studies: None  Laboratory Data:  Chemistry Recent Labs  Lab 06/14/18 1932 06/15/18 0213  NA 143 140  K 3.2* 3.5  CL 113* 107  CO2 22 22  GLUCOSE 78  121*  BUN 13 13  CREATININE 1.42* 1.17  CALCIUM 7.7* 8.4*  GFRNONAA >60 >60  GFRAA >60 >60  ANIONGAP 8 11    Recent Labs  Lab 06/14/18 1932  PROT 6.6  ALBUMIN 3.7  AST 20  ALT 13  ALKPHOS 48  BILITOT 0.8   Hematology Recent Labs  Lab 06/14/18 1932 06/15/18 0213  WBC 6.6 5.5  RBC 4.83 4.52  HGB 14.4 13.7  HCT 45.2 41.5  MCV 93.6 91.8  MCH 29.8 30.3  MCHC 31.9 33.0  RDW 12.6 12.7  PLT 206 180   Cardiac Enzymes Recent Labs  Lab 06/14/18 1932 06/15/18 0213  TROPONINI <0.03 <0.03   No results for input(s): TROPIPOC in the last 168 hours.  BNPNo results for input(s): BNP, PROBNP in the last 168 hours.  DDimer No results for input(s): DDIMER in the last 168 hours.  Radiology/Studies:  Ct Angio Chest/abd/pel For Dissection W And/or Wo Contrast  Result Date: 06/14/2018 CLINICAL DATA:  Left-sided chest pain radiating to neck and left arm. Pain worse with inspiration. Left hand swelling and numbness. EXAM: CT ANGIOGRAPHY CHEST, ABDOMEN AND PELVIS TECHNIQUE: Multidetector CT imaging through the chest, abdomen and pelvis was performed using the standard protocol during bolus administration of intravenous contrast. Multiplanar reconstructed images and MIPs were obtained and reviewed to evaluate the vascular anatomy. CONTRAST:  ISOVUE-370 IOPAMIDOL (ISOVUE-370) INJECTION 76% COMPARISON:  None. FINDINGS: CTA CHEST FINDINGS Cardiovascular: Satisfactory opacification of the pulmonary arteries to the segmental level. No evidence of  pulmonary embolism. Normal heart size. No pericardial effusion. Mediastinum/Nodes: No enlarged mediastinal, hilar, or axillary lymph nodes. Thyroid gland, trachea, and esophagus demonstrate no significant findings. Lungs/Pleura: Central airways are normal. Scattered subsegmental atelectasis. No pneumothorax. No suspicious pulmonary nodules, masses, or infiltrates. Musculoskeletal: See below. Review of the MIP images confirms the above findings. CTA ABDOMEN AND PELVIS FINDINGS VASCULAR Aorta: Normal caliber aorta without aneurysm, dissection, vasculitis or significant stenosis. Celiac: Patent without evidence of aneurysm, dissection, vasculitis or significant stenosis. SMA: Patent without evidence of aneurysm, dissection, vasculitis or significant stenosis. Renals: There are 2 right renal arteries, a dominant and an accessory and 1 left renal artery. The renal arteries are unremarkable. IMA: Patent without evidence of aneurysm, dissection, vasculitis or significant stenosis. Inflow: Patent without evidence of aneurysm, dissection, vasculitis or significant stenosis. Veins: No obvious venous abnormality within the limitations of this arterial phase study. Review of the MIP images confirms the above findings. NON-VASCULAR Hepatobiliary: No focal liver abnormality is seen. No gallstones, gallbladder wall thickening, or biliary dilatation. Pancreas: Unremarkable. No pancreatic ductal dilatation or surrounding inflammatory changes. Spleen: Normal in size without focal abnormality. Adrenals/Urinary Tract: Adrenal glands are unremarkable. Kidneys are normal, without renal calculi, focal lesion, or hydronephrosis. Bladder is unremarkable. Stomach/Bowel: Stomach is within normal limits. Appendix appears normal. No evidence of bowel wall thickening, distention, or inflammatory changes. Lymphatic: No significant vascular findings are present. No enlarged abdominal or pelvic lymph nodes. Reproductive: Prostate is unremarkable.  Other: Left inguinal hernia repair, intact. Fat containing umbilical hernia. Musculoskeletal: No acute or significant osseous findings. Review of the MIP images confirms the above findings. IMPRESSION: 1. No abdominal aortic aneurysm or dissection. No cause for symptoms identified. 2. Left inguinal hernia repair. 3. Scattered subsegmental atelectasis throughout the lungs. Electronically Signed   By: Gerome Sam III M.D   On: 06/14/2018 21:32    Assessment and Plan:   1. Atypical chest pain: patient presented with substernal chest pain radiating to his left arm  at rest. TTP of lower sternum. EKG with submm STE in I and aVL (early repol?), otherwise non-ischemic - no comparison available. Trop negative x2. Utox negative. CTA C/A/P negative for dissection or aneurysm. Risk factors for ACS include obesity and family history of CAD. No prior ischemic evaluation. Possible symptoms are related to costochondritis and recent gastroenteritis - reflux +/- pulled muscle from vomiting.  - Trial GI cocktail to see if symptoms improve - Check FLP and Hgb A1C for risk stratification - Will check an echo - if no significant findings, okay for discharge from a cardiology standpoint.  2. LUE numbness: MRI brain pending per Neurology recommendations - Continue management per primary team  3. Obesity: BMI 36.  - Continue to encourage healthy dietary and lifestyle modifications to promote weight loss   For questions or updates, please contact [facility] Please consult [email or web address] for contact info under Cardiology/STEMI.   Signed, Arthur Stallion, PA-C  06/15/2018 8:07 AM [phone]  Arthur Brewer seen and examined   I agree with findings as noted by K Kroeger aboe Arthur Brewer is a 37 yo with no prior cardiac histroy   FHx signif for 2 cousins with early CAD (unrelated uncle with FHX of CAD)   FHx also signif for father who may have an ICD for CHF (no CAD;  Rx in [place])    Arthur Brewer with CP since yesterday  Worse with breath   Improving   Alos still with L arm numbness On exam:   Lungs are CTA Cardiac RRR   No S3  No murmurs  No rub Chest Tender to palpation Abd supple   Ext are without edema  CT without evid of arterial calcifications      IMPRESSION Chest pain    Does not appear to be cardiac   Consistent with musculoskeletal  He did have 1 episode of vomiting on the weekend  ? If pulled something   ? Post viral I am not sure why L arm still with  numb feeling    I have reviewed echo   No effusion   LVERF normal with normal wall motioin   I would rx with pain meds   FHX:    FHX of [place] CHF in father   He needs to clarify   It sounds like father has ICD     Echo today with normal LVEF    Stressed he should get history   OK to d/c from cardiac standpoint.  Arthur Brewer

## 2018-06-15 NOTE — ED Notes (Signed)
ED TO INPATIENT HANDOFF REPORT  Name/Age/Gender Arthur Brewer 37 y.o. male  Code Status   Home/SNF/Other Home  Chief Complaint left sided chest pain and arm numbness  Level of Care/Admitting Diagnosis ED Disposition    ED Disposition Condition [facility] Area: [place] [100100]  Level of Care: Stepdown [14]  I expect the patient will be discharged within 24 hours: No (not a candidate for 5C-Observation unit)  Diagnosis: Chest pain [440347]  Admitting Physician: Rise Patience [phone]  Attending Physician: Rise Patience [phone]  PT Class (Do Not Modify): Observation [104]  PT Acc Code (Do Not Modify): Observation [10022]       Medical History Past Medical History:  Diagnosis Date  . Migraine     Allergies No Known Allergies  IV Location/Drains/Wounds Patient Lines/Drains/Airways Status   Active Line/Drains/Airways    Name:   Placement date:   Placement time:   Site:   Days:   Peripheral IV 06/14/18 Left Antecubital   06/14/18    1930    Antecubital   1          Labs/Imaging Results for orders placed or performed during the hospital encounter of 06/14/18 (from the past 48 hour(s))  CBC with Differential     Status: None   Collection Time: 06/14/18  7:32 PM  Result Value Ref Range   WBC 6.6 4.0 - 10.5 K/uL   RBC 4.83 4.22 - 5.81 MIL/uL   Hemoglobin 14.4 13.0 - 17.0 g/dL   HCT 45.2 39.0 - 52.0 %   MCV 93.6 80.0 - 100.0 fL   MCH 29.8 26.0 - 34.0 pg   MCHC 31.9 30.0 - 36.0 g/dL   RDW 12.6 11.5 - 15.5 %   Platelets 206 150 - 400 K/uL   nRBC 0.0 0.0 - 0.2 %   Neutrophils Relative % 48 %   Neutro Abs 3.1 1.7 - 7.7 K/uL   Lymphocytes Relative 39 %   Lymphs Abs 2.6 0.7 - 4.0 K/uL   Monocytes Relative 11 %   Monocytes Absolute 0.8 0.1 - 1.0 K/uL   Eosinophils Relative 2 %   Eosinophils Absolute 0.1 0.0 - 0.5 K/uL   Basophils Relative 0 %   Basophils Absolute 0.0 0.0 - 0.1 K/uL   Immature Granulocytes 0  %   Abs Immature Granulocytes 0.02 0.00 - 0.07 K/uL    Comment: Performed at [facility], [address], [place], [place] [address]  Comprehensive metabolic panel     Status: Abnormal   Collection Time: 06/14/18  7:32 PM  Result Value Ref Range   Sodium 143 135 - 145 mmol/L   Potassium 3.2 (L) 3.5 - 5.1 mmol/L   Chloride 113 (H) 98 - 111 mmol/L   CO2 22 22 - 32 mmol/L   Glucose, Bld 78 70 - 99 mg/dL   BUN 13 6 - 20 mg/dL   Creatinine, Ser 1.42 (H) 0.61 - 1.24 mg/dL   Calcium 7.7 (L) 8.9 - 10.3 mg/dL   Total Protein 6.6 6.5 - 8.1 g/dL   Albumin 3.7 3.5 - 5.0 g/dL   AST 20 15 - 41 U/L   ALT 13 0 - 44 U/L   Alkaline Phosphatase 48 38 - 126 U/L   Total Bilirubin 0.8 0.3 - 1.2 mg/dL   GFR calc non Af Amer >60 >60 mL/min   GFR calc Af Amer >60 >60 mL/min    Comment: (NOTE) The eGFR has been  calculated using the CKD EPI equation. This calculation has not been validated in all clinical situations. eGFR's persistently <60 mL/min signify possible Chronic Kidney Disease.    Anion gap 8 5 - 15    Comment: Performed at [facility] [address] [place], [place] [address]  Troponin I - ONCE - STAT     Status: None   Collection Time: 06/14/18  7:32 PM  Result Value Ref Range   Troponin I <0.03 <0.03 ng/mL    Comment: Performed at [facility], [place] [address] [place], [place] [address]   Ct Angio Chest/abd/pel For Dissection W And/or Wo Contrast  Result Date: 06/14/2018 CLINICAL DATA:  Left-sided chest pain radiating to neck and left arm. Pain worse with inspiration. Left hand swelling and numbness. EXAM: CT ANGIOGRAPHY CHEST, ABDOMEN AND PELVIS TECHNIQUE: Multidetector CT imaging through the chest, abdomen and pelvis was performed using the standard protocol during bolus administration of intravenous contrast. Multiplanar reconstructed images and MIPs were obtained and reviewed to evaluate the vascular anatomy. CONTRAST:  179m ISOVUE-370 IOPAMIDOL (ISOVUE-370)  INJECTION 76% COMPARISON:  None. FINDINGS: CTA CHEST FINDINGS Cardiovascular: Satisfactory opacification of the pulmonary arteries to the segmental level. No evidence of pulmonary embolism. Normal heart size. No pericardial effusion. Mediastinum/Nodes: No enlarged mediastinal, hilar, or axillary lymph nodes. Thyroid gland, trachea, and esophagus demonstrate no significant findings. Lungs/Pleura: Central airways are normal. Scattered subsegmental atelectasis. No pneumothorax. No suspicious pulmonary nodules, masses, or infiltrates. Musculoskeletal: See below. Review of the MIP images confirms the above findings. CTA ABDOMEN AND PELVIS FINDINGS VASCULAR Aorta: Normal caliber aorta without aneurysm, dissection, vasculitis or significant stenosis. Celiac: Patent without evidence of aneurysm, dissection, vasculitis or significant stenosis. SMA: Patent without evidence of aneurysm, dissection, vasculitis or significant stenosis. Renals: There are 2 right renal arteries, a dominant and an accessory and 1 left renal artery. The renal arteries are unremarkable. IMA: Patent without evidence of aneurysm, dissection, vasculitis or significant stenosis. Inflow: Patent without evidence of aneurysm, dissection, vasculitis or significant stenosis. Veins: No obvious venous abnormality within the limitations of this arterial phase study. Review of the MIP images confirms the above findings. NON-VASCULAR Hepatobiliary: No focal liver abnormality is seen. No gallstones, gallbladder wall thickening, or biliary dilatation. Pancreas: Unremarkable. No pancreatic ductal dilatation or surrounding inflammatory changes. Spleen: Normal in size without focal abnormality. Adrenals/Urinary Tract: Adrenal glands are unremarkable. Kidneys are normal, without renal calculi, focal lesion, or hydronephrosis. Bladder is unremarkable. Stomach/Bowel: Stomach is within normal limits. Appendix appears normal. No evidence of bowel wall thickening,  distention, or inflammatory changes. Lymphatic: No significant vascular findings are present. No enlarged abdominal or pelvic lymph nodes. Reproductive: Prostate is unremarkable. Other: Left inguinal hernia repair, intact. Fat containing umbilical hernia. Musculoskeletal: No acute or significant osseous findings. Review of the MIP images confirms the above findings. IMPRESSION: 1. No abdominal aortic aneurysm or dissection. No cause for symptoms identified. 2. Left inguinal hernia repair. 3. Scattered subsegmental atelectasis throughout the lungs. Electronically Signed   By: DDorise BullionIII M.D   On: 06/14/2018 21:32   EKG Interpretation  Date/Time:  Monday June 14 2018 19:27:35 EST Ventricular Rate:  79 PR Interval:    QRS Duration: 89 QT Interval:  382 QTC Calculation: 438 R Axis:   19 Text Interpretation:  Sinus rhythm No old tracing to compare Confirmed by MMerrily [phone] on 06/14/2018 10:01:29 PM   Pending Labs Unresulted Labs (From admission, onward)    Start     Ordered  06/14/18 2347  Rapid urine drug screen (hospital performed)  ONCE - STAT,   R     06/14/18 2346          Vitals/Pain Today's Vitals   06/14/18 1955 06/14/18 2000 06/14/18 2237 06/14/18 2342  BP:  108/70 95/62 104/79  Pulse:  72 68 69  Resp:  (!) 22 20 18   Temp:      TempSrc:      SpO2:  96% 95% 96%  Weight:      Height:      PainSc: 8    6     Isolation Precautions No active isolations  Medications Medications  nitroGLYCERIN (NITROSTAT) SL tablet 0.4 mg (0.4 mg Sublingual Given 06/14/18 1932)  iopamidol (ISOVUE-370) 76 % injection (has no administration in time range)  fentaNYL (SUBLIMAZE) injection 50 mcg (has no administration in time range)  aspirin chewable tablet 324 mg (has no administration in time range)  iopamidol (ISOVUE-370) 76 % injection 100 mL (100 mLs Intravenous Contrast Given 06/14/18 2053)    Mobility walks

## 2018-06-15 NOTE — Progress Notes (Signed)
  Echocardiogram 2D Echocardiogram has been performed.  Delcie RochENNINGTON, Koal Eslinger 06/15/2018, 12:24 PM

## 2020-10-04 IMAGING — MR MR HEAD W/O CM
9 of 10 series · 35 of 48 positions shown · non-contrast
Comparison: Head CT 08/08/2009

CLINICAL DATA: Left arm paresthesias with preceding chest pain
radiating to the neck and arm and associated high blood pressure.
History of migraines.

EXAM:
MRI HEAD WITHOUT CONTRAST
TECHNIQUE: Multiplanar, multiecho pulse sequences of the brain and surrounding
structures were obtained without intravenous contrast.

[Series 3: DWI · axial · 3.0mm · 0.94mm/px · z∈[-56,+91]mm · 8 of 100 slices shown (1 of 2)]
[im 1/100]
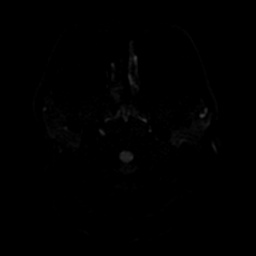
[im 12/100]
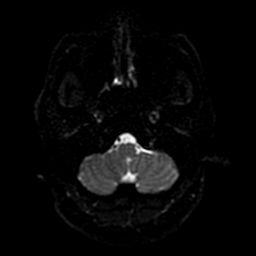
[im 34/100]
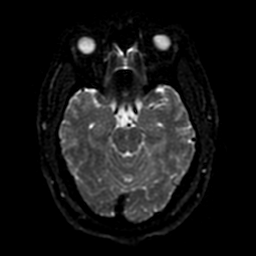
[im 45/100]
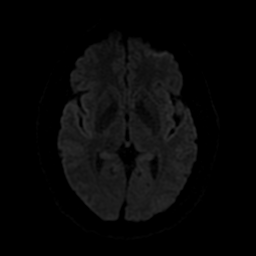
[im 56/100]
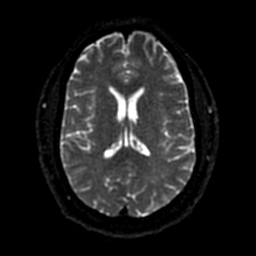
[im 67/100]
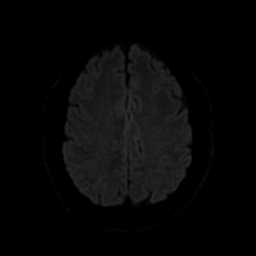
[im 89/100]
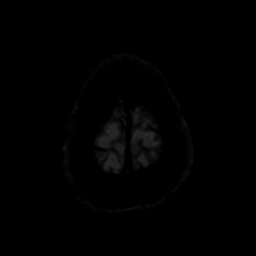
[im 100/100]
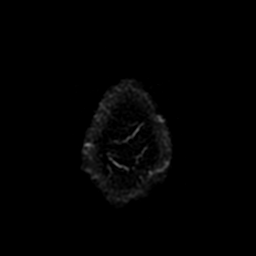

[Series 4: DWI · coronal · 4.0mm · 0.94mm/px · 7 of 72 slices shown (2 of 2)]
[im 1/72]
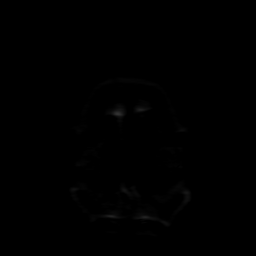
[im 12/72]
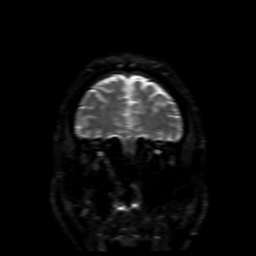
[im 24/72]
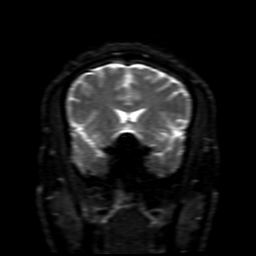
[im 36/72]
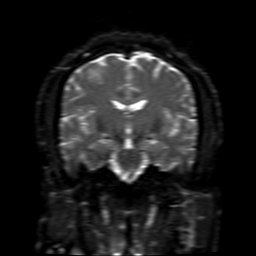
[im 48/72]
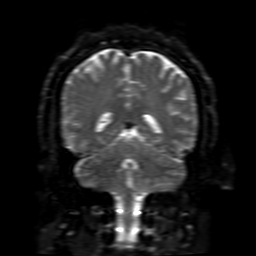
[im 60/72]
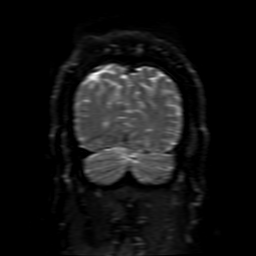
[im 72/72]
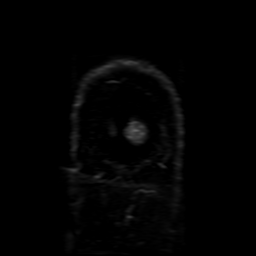

[Series 5: T2 · axial · 5.0mm · 0.47mm/px · z∈[-65,+84]mm · 2 of 26 slices shown (1 of 2)]
[im 1/26]
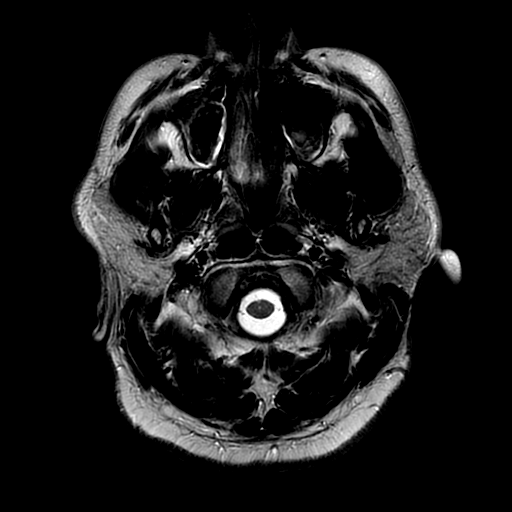
[im 26/26]
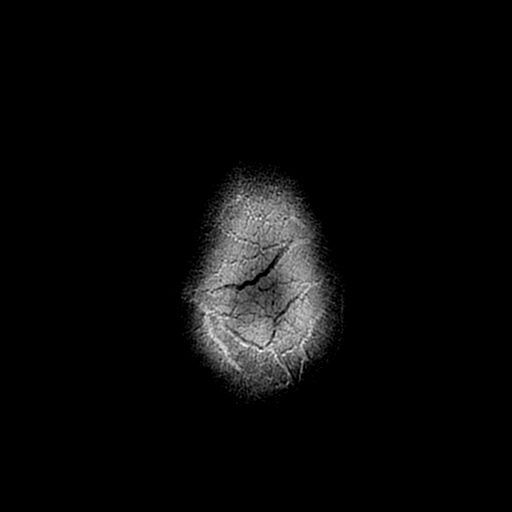

[Series 6: FLAIR · axial · 3.0mm · 0.47mm/px · z∈[-63,+87]mm · 2 of 26 slices shown (1 of 2)]
[im 1/26]
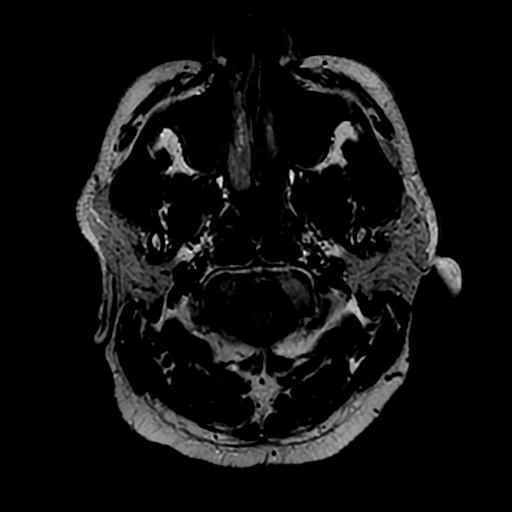
[im 26/26]
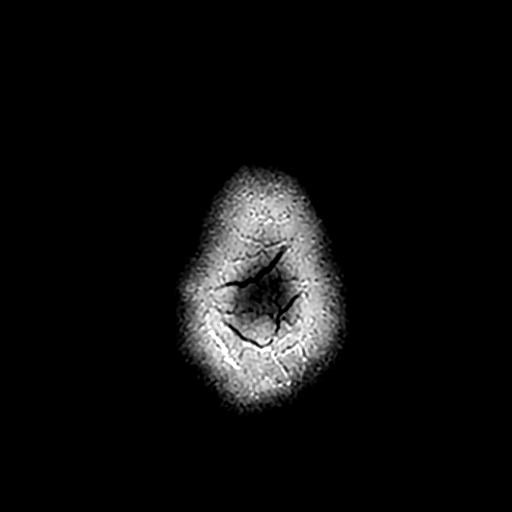

[Series 7: (person_name) · axial · 3.0mm · 0.47mm/px · z∈[-57,-21]mm · 3 of 100 slices shown]
[im 1/100]
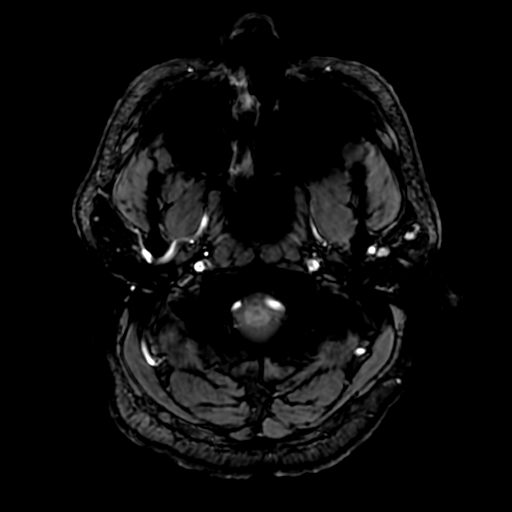
[im 13/100]
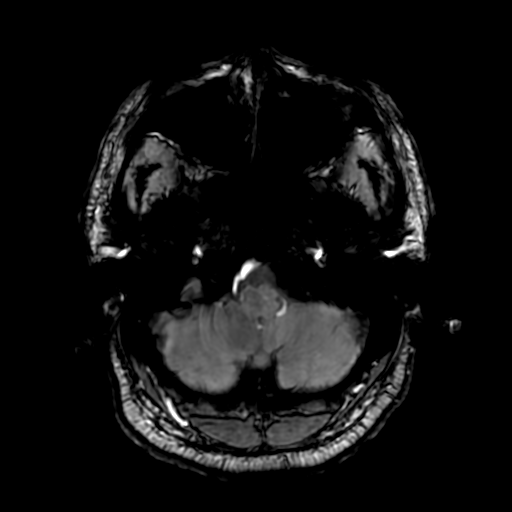
[im 25/100]
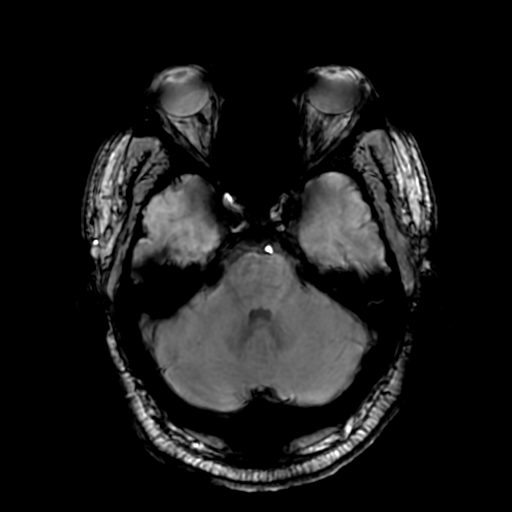

[Series 8: FLAIR · sagittal · 5.0mm · 0.47mm/px · 2 of 25 slices shown (2 of 2)]
[im 1/25]
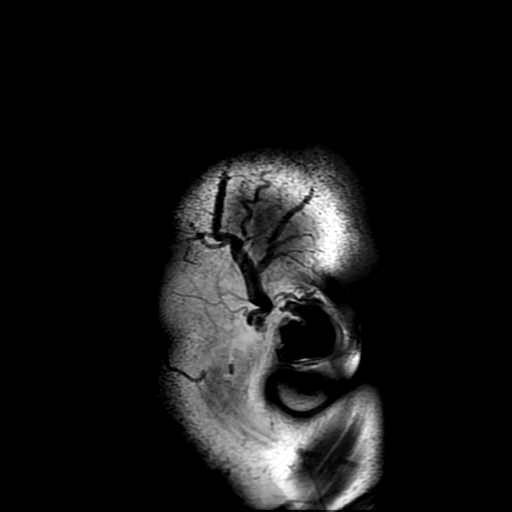
[im 25/25]
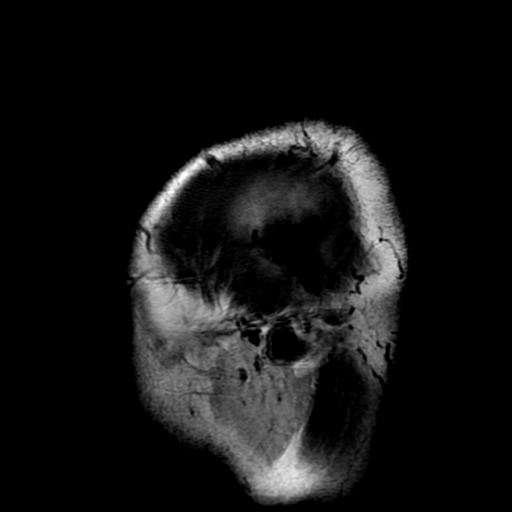

[Series 10: T2 · coronal · 5.0mm · 0.39mm/px · 3 of 30 slices shown (2 of 2)]
[im 1/30]
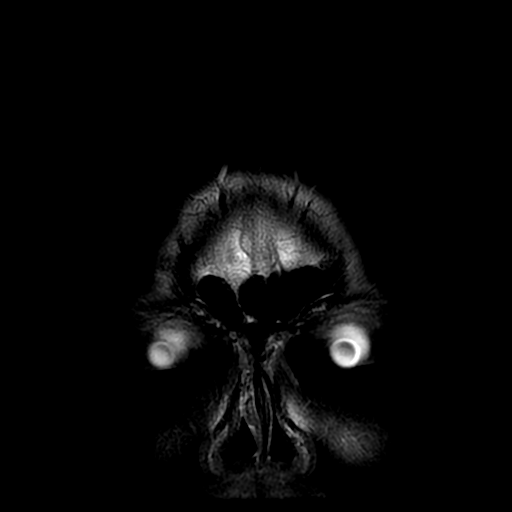
[im 15/30]
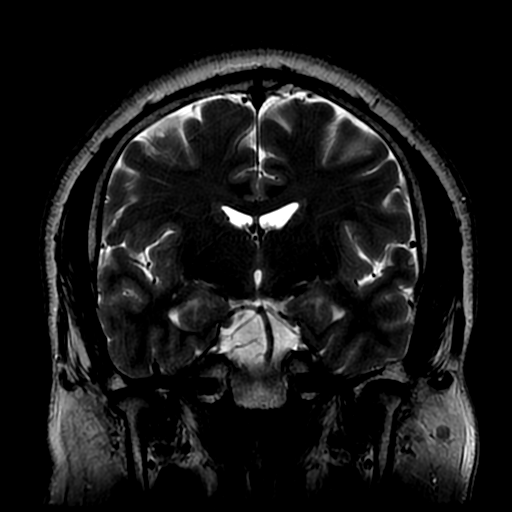
[im 30/30]
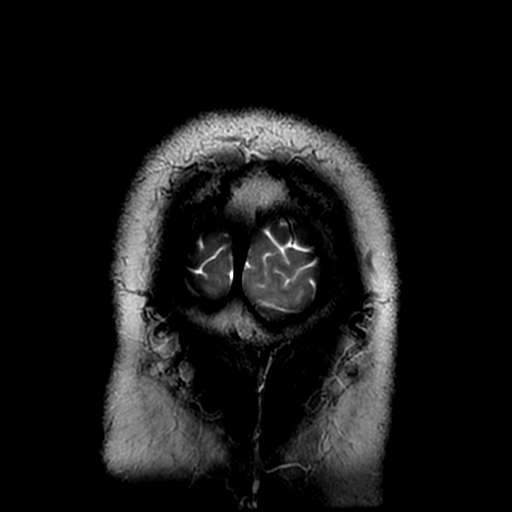

[Series 350: ADC · axial · 3.0mm · 0.94mm/px · z∈[-56,+91]mm · 5 of 49 slices shown (1 of 2)]
[im 1/49]
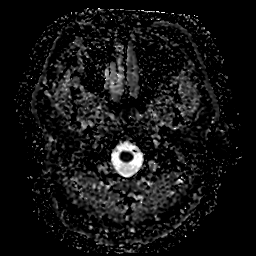
[im 13/49]
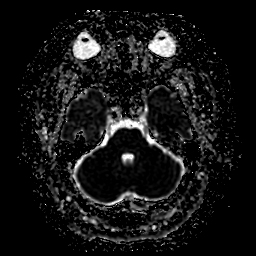
[im 25/49]
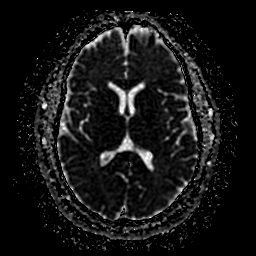
[im 37/49]
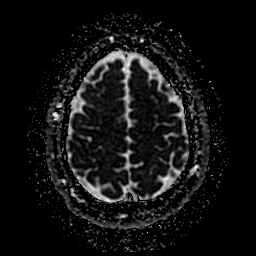
[im 49/49]
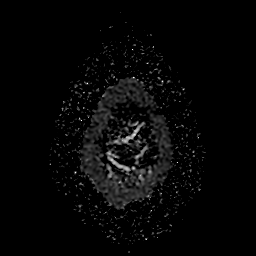

[Series 450: ADC · coronal · 4.0mm · 0.94mm/px · 3 of 36 slices shown (2 of 2)]
[im 1/36]
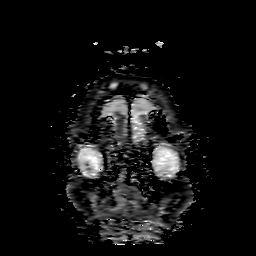
[im 18/36]
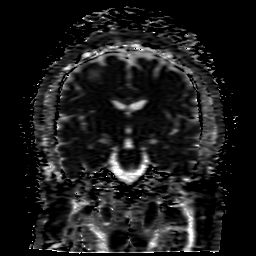
[im 36/36]
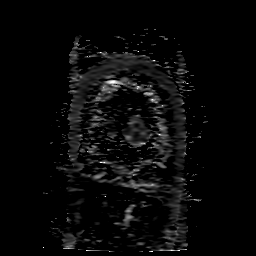

[35 of 48 positions shown; findings below may reference images not displayed]

FINDINGS: Brain: There is no evidence of acute infarct, intracranial
hemorrhage, mass, midline shift, or extra-axial fluid collection.
The ventricles and sulci are normal. There are multiple punctate
foci of T2 hyperintensity in the white matter of both frontal lobes,
mild in severity though abnormal for age.

Vascular: Major intracranial vascular flow voids are preserved.

Skull and upper cervical spine: Unremarkable bone marrow signal.

Sinuses/Orbits: Unremarkable orbits. Minimal right maxillary and
bilateral ethmoid sinus mucosal thickening. Clear mastoid air cells.

Other: None.
IMPRESSION: 1. No acute intracranial abnormality.
2. Foci of cerebral white matter T2 signal abnormality, nonspecific
though may reflect the patient's history of migraines, early chronic
small-vessel ischemia, or prior infection/inflammation. No findings
specifically suggestive of demyelinating disease.
